# Patient Record
Sex: Female | Born: 1985 | Hispanic: Yes | Marital: Single | Smoking: Never smoker
Health system: Southern US, Community
[De-identification: ages and names within clinical notes are randomized; demographics above are authoritative.]

## PROBLEM LIST (undated history)

## (undated) DIAGNOSIS — R87629 Unspecified abnormal cytological findings in specimens from vagina: Secondary | ICD-10-CM

## (undated) HISTORY — DX: Unspecified abnormal cytological findings in specimens from vagina: R87.629

---

## 2015-11-08 ENCOUNTER — Ambulatory Visit (INDEPENDENT_AMBULATORY_CARE_PROVIDER_SITE_OTHER): Payer: Self-pay | Admitting: Osteopathic Medicine

## 2015-11-08 ENCOUNTER — Encounter: Payer: Self-pay | Admitting: Osteopathic Medicine

## 2015-11-08 VITALS — BP 95/56 | HR 78 | Ht 61.5 in | Wt 162.0 lb

## 2015-11-08 DIAGNOSIS — E049 Nontoxic goiter, unspecified: Secondary | ICD-10-CM | POA: Insufficient documentation

## 2015-11-08 DIAGNOSIS — Z0189 Encounter for other specified special examinations: Secondary | ICD-10-CM

## 2015-11-08 DIAGNOSIS — Z008 Encounter for other general examination: Secondary | ICD-10-CM

## 2015-11-08 DIAGNOSIS — Z3483 Encounter for supervision of other normal pregnancy, third trimester: Secondary | ICD-10-CM

## 2015-11-08 LAB — LIPID PANEL
CHOL/HDL RATIO: 2.5 ratio (ref <=5.0)
Cholesterol: 203 mg/dL — ABNORMAL HIGH (ref 125–200)
HDL: 80 mg/dL (ref >=46)
LDL CALC: 95 mg/dL (ref <130)
TRIGLYCERIDES: 142 mg/dL (ref <150)
VLDL: 28 mg/dL (ref <30)

## 2015-11-08 LAB — GLUCOSE, RANDOM: Glucose, Bld: 73 mg/dL (ref 65–99)

## 2015-11-08 NOTE — Patient Instructions (Signed)
Please let us know if you haven't heard back from our office about your results and your paperwork by Wednesday of this week! We will fax the completed form and leave a copy for you to pick up at our front desk, as well. Your cholesterol may be high due to pregnancy. I will note on your form that pregnancy may affect the lab values. Please let me know if your insurer or employer has any questions about the form.   Take care!

## 2015-11-08 NOTE — Progress Notes (Signed)
HPI: Morgan Avila is a 30 y.o. female who presents to [facility] today for chief complaint of:  Chief Complaint  Patient presents with  . Establish Care    BIOMETRIC SCREEN      Patient here to get biometric screening for work/insurance purposes. She brings the form with her. No other complaints. No chronic medical conditions. Patient is currently pregnant, following with OB/GYN and no problems with this pregnancy. No history of gestational hypertension or diabetes.  Records reviewed:  10/29/15 OBGYN visit for prenatal care - G3P1 at [redacted]w[redacted]d putting her at [redacted]w[redacted]d now.  Recent one-hour glucose test negative. Reviewed recent HIV, Pap, flu shot.     Past medical, social and family history reviewed: History reviewed. No pertinent past medical history. History reviewed. No pertinent past surgical history. Social History  Substance Use Topics  . Smoking status: Never Smoker   . Smokeless tobacco: Not on file  . Alcohol Use: Not on file   History reviewed. No pertinent family history.  Current Outpatient Prescriptions  Medication Sig Dispense Refill  . Prenatal Vit-Fe Fumarate-FA (PRENATAL MULTIVITAMIN) TABS tablet Take 1 tablet by mouth daily at 12 noon.     No current facility-administered medications for this visit.   No Known Allergies     Review of Systems: CONSTITUTIONAL:  No  fever, no chills, No  unintentional weight changes HEAD/EYES/EARS/NOSE/THROAT: No  headache, no vision change, no hearing change, No  sore throat, No  sinus pressure CARDIAC: No  chest pain, No  pressure, No palpitations, No  orthopnea RESPIRATORY: No  cough, No  shortness of breath/wheeze GASTROINTESTINAL: No  nausea, No  vomiting, No  abdominal pain, No  blood in stool, No  diarrhea, No  constipation  MUSCULOSKELETAL: No  myalgia/arthralgia GENITOURINARY: No  incontinence, No  abnormal genital bleeding/discharge SKIN: No  rash/wounds/concerning lesions HEM/ONC: No   easy bruising/bleeding, No  abnormal lymph node ENDOCRINE: No polyuria/polydipsia/polyphagia, No  heat/cold intolerance  NEUROLOGIC: No  weakness, No  dizziness, No  slurred speech PSYCHIATRIC: No  concerns with depression, No  concerns with anxiety, No sleep problems  Exam:  BP 95/56 mmHg  Pulse 78  Ht 5' 1.5" (1.562 m)  Wt 162 lb (73.483 kg)  BMI 30.12 kg/m2 Constitutional: VS see above. General Appearance: alert, well-developed, well-nourished, NAD Eyes: Normal lids and conjunctive, non-icteric sclera,  Ears, Nose, Mouth, Throat: MMM, Normal external inspection ears/nares/mouth/lips/gums,  Neck: No masses, trachea midline. Mild symmetric thyroid enlargement. No tenderness/mass appreciated. No lymphadenopathy Respiratory: Normal respiratory effort. no wheeze, no rhonchi, no rales Cardiovascular: S1/S2 normal, no murmur, no rub/gallop auscultated. RRR.  No lower extremity edema. Gastrointestinal: Nontender, no masses. Gravid uterus Musculoskeletal: Gait normal. No clubbing/cyanosis of digits.  Neurological: No cranial nerve deficit on limited exam. Motor and sensation intact and symmetric Skin: warm, dry, intact. No rash/ulcer. No concerning nevi or subq nodules on limited exam.   Psychiatric: Normal judgment/insight. Normal mood and affect. Oriented x3.    No results found for this or any previous visit (from the past 72 hour(s)).    ASSESSMENT/PLAN:  Encounter for biometric screening - Of note, lipids expected to be elevated given that the patient is pregnant. Waist circumference was not measured. - Plan: Glucose, Lipid panel  Encounter for supervision of other normal pregnancy in third trimester - Records reviewed, following with OB/GYN, no concerns at this time.   Enlarged thyroid - Mildly enlarged, soft, no nodules, nontender. Would hold off on TSH until after pregnancy as  patient is currently asymptomatic.   Return AS NEEDED.

## 2016-11-30 ENCOUNTER — Encounter: Payer: Self-pay | Admitting: Osteopathic Medicine

## 2016-11-30 ENCOUNTER — Ambulatory Visit (INDEPENDENT_AMBULATORY_CARE_PROVIDER_SITE_OTHER): Payer: 59 | Admitting: Osteopathic Medicine

## 2016-11-30 VITALS — BP 105/60 | HR 65 | Ht 64.0 in | Wt 146.0 lb

## 2016-11-30 DIAGNOSIS — Z0000 Encounter for general adult medical examination without abnormal findings: Secondary | ICD-10-CM | POA: Diagnosis not present

## 2016-11-30 DIAGNOSIS — E049 Nontoxic goiter, unspecified: Secondary | ICD-10-CM

## 2016-11-30 DIAGNOSIS — Z0189 Encounter for other specified special examinations: Secondary | ICD-10-CM

## 2016-11-30 DIAGNOSIS — Z008 Encounter for other general examination: Secondary | ICD-10-CM

## 2016-11-30 LAB — COMPLETE METABOLIC PANEL WITH GFR
ALBUMIN: 3.8 g/dL (ref 3.6–5.1)
ALK PHOS: 63 U/L (ref 33–115)
ALT: 10 U/L (ref 6–29)
AST: 14 U/L (ref 10–30)
BILIRUBIN TOTAL: 0.5 mg/dL (ref 0.2–1.2)
BUN: 15 mg/dL (ref 7–25)
CALCIUM: 8.9 mg/dL (ref 8.6–10.2)
CO2: 24 mmol/L (ref 20–31)
CREATININE: 0.65 mg/dL (ref 0.50–1.10)
Chloride: 106 mmol/L (ref 98–110)
GFR, Est African American: >89 mL/min (ref >=60)
GFR, Est Non African American: >89 mL/min (ref >=60)
Glucose, Bld: 75 mg/dL (ref 65–99)
POTASSIUM: 4.1 mmol/L (ref 3.5–5.3)
Sodium: 141 mmol/L (ref 135–146)
Total Protein: 6.7 g/dL (ref 6.1–8.1)

## 2016-11-30 LAB — LIPID PANEL
CHOL/HDL RATIO: 2.3 ratio (ref <5.0)
CHOLESTEROL: 119 mg/dL (ref <200)
HDL: 52 mg/dL (ref >50)
LDL Cholesterol: 61 mg/dL (ref <100)
Triglycerides: 30 mg/dL (ref <150)
VLDL: 6 mg/dL (ref <30)

## 2016-11-30 LAB — TSH: TSH: 0.66 m[IU]/L

## 2016-11-30 NOTE — Progress Notes (Signed)
HPI: Morgan Avila is a 31 y.o. female who presents to [facility] today for chief complaint of:  Chief Complaint  Patient presents with  . Annual Exam    Patient here to get biometric screening for work/insurance purposes. She brings the form with her. No other complaints. No chronic medical conditions. No history of gestational hypertension or diabetes.  See below for review of preventive care    Past medical, social and family history reviewed: History reviewed. No pertinent past medical history. History reviewed. No pertinent surgical history. Social History  Substance Use Topics  . Smoking status: Never Smoker  . Smokeless tobacco: Not on file  . Alcohol use Not on file   No family history on file.  Current Outpatient Prescriptions  Medication Sig Dispense Refill  . PRESCRIPTION MEDICATION      No current facility-administered medications for this visit.    No Known Allergies     Review of Systems: CONSTITUTIONAL:  No  fever, no chills, No  unintentional weight changes HEAD/EYES/EARS/NOSE/THROAT: No  headache, no vision change CARDIAC: No  chest pain, No  pressure, No palpitations, No  orthopnea RESPIRATORY: No  cough, No  shortness of breath/wheeze GASTROINTESTINAL: No  nausea, No  vomiting, No  abdominal pain, MUSCULOSKELETAL: No  myalgia/arthralgia GENITOURINARY:No  abnormal genital bleeding/discharge SKIN: No  rash/wounds/concerning lesions ENDOCRINE: No polyuria/polydipsia/polyphagia, No  heat/cold intolerance  NEUROLOGIC: No  weakness, No  dizziness PSYCHIATRIC: No  concerns with depression, No  concerns with anxiety, No sleep problems  Exam:  BP 105/60   Pulse 65   Ht 5\' 4"  (1.626 m)   Wt 146 lb (66.2 kg)   BMI 25.06 kg/m  Constitutional: VS see above. General Appearance: alert, well-developed, well-nourished, NAD Eyes: Normal lids and conjunctive, non-icteric sclera,  Ears, Nose, Mouth, Throat: MMM, Normal external  inspection ears/nares/mouth/lips/gums,  Neck: No masses, trachea midline. Mild symmetric thyroid enlargement. No tenderness/mass appreciated. No lymphadenopathy Respiratory: Normal respiratory effort. no wheeze, no rhonchi, no rales Cardiovascular: S1/S2 normal, no murmur, no rub/gallop auscultated. RRR.  No lower extremity edema. Gastrointestinal: Nontender, no masses. Gravid uterus Musculoskeletal: Gait normal. No clubbing/cyanosis of digits.  Neurological: No cranial nerve deficit on limited exam. Motor and sensation intact and symmetric Skin: warm, dry, intact. No rash/ulcer. No concerning nevi or subq nodules on limited exam.   Psychiatric: Normal judgment/insight. Normal mood and affect. Oriented x3.    ASSESSMENT/PLAN:  Annual physical exam - Plan: Lipid panel, COMPLETE METABOLIC PANEL WITH GFR  Enlarged thyroid - Plan: TSH  Encounter for biometric screening - Plan: Lipid panel, COMPLETE METABOLIC PANEL WITH GFR    FEMALE PREVENTIVE CARE Updated 11/30/16   ANNUAL SCREENING/COUNSELING  Diet/Exercise - HEALTHY HABITS DISCUSSED TO DECREASE CV RISK History  Smoking Status  . Never Smoker  Smokeless Tobacco  . Never Used   History  Alcohol use Not on file   Depression screen [facility] 2/9 11/30/2016  Decreased Interest 0  Down, Depressed, Hopeless 0  PHQ - 2 Score 0    Domestic violence concerns - no  HTN SCREENING - SEE VITALS  SEXUAL HEALTH  Sexually active in the past year - Yes with female.  Need/want STI testing today? - no  Concerns about libido or pain with sex? - no  Plans for pregnancy? - not at this time   INFECTIOUS DISEASE SCREENING  HIV - does not need  GC/CT - does not need  HepC - DOB 1945-1965 - does not need  TB -  does not need  DISEASE SCREENING  Lipid - needs  DM2 - needs  Osteoporosis - women age 33+ - does not need  CANCER SCREENING  Cervical - does not need  Breast - does not need  Lung - does not need  Colon - does not  need  ADULT VACCINATION  Influenza - annual vaccine recommended  Td - booster every 10 years   Zoster - option at 50, yes at 60+   PCV13 - was not indicated  PPSV23 - was not indicated  There is no immunization history on file for this patient.      [facility] (around [facility]30/2017[facility] sooner if needed / depending on lab results .

## 2017-11-30 ENCOUNTER — Encounter: Payer: 59 | Admitting: Osteopathic Medicine

## 2017-12-12 ENCOUNTER — Ambulatory Visit (INDEPENDENT_AMBULATORY_CARE_PROVIDER_SITE_OTHER): Payer: 59 | Admitting: Osteopathic Medicine

## 2017-12-12 ENCOUNTER — Encounter: Payer: Self-pay | Admitting: Osteopathic Medicine

## 2017-12-12 VITALS — BP 110/63 | HR 73 | Temp 98.7°F | Ht 64.0 in | Wt 144.0 lb

## 2017-12-12 DIAGNOSIS — Z23 Encounter for immunization: Secondary | ICD-10-CM

## 2017-12-12 DIAGNOSIS — Z0000 Encounter for general adult medical examination without abnormal findings: Secondary | ICD-10-CM | POA: Diagnosis not present

## 2017-12-12 LAB — COMPLETE METABOLIC PANEL WITH GFR
AG Ratio: 1.5 (calc) (ref 1.0–2.5)
ALBUMIN MSPROF: 4.1 g/dL (ref 3.6–5.1)
ALKALINE PHOSPHATASE (APISO): 77 U/L (ref 33–115)
ALT: 9 U/L (ref 6–29)
AST: 17 U/L (ref 10–30)
BILIRUBIN TOTAL: 0.4 mg/dL (ref 0.2–1.2)
BUN: 13 mg/dL (ref 7–25)
CHLORIDE: 104 mmol/L (ref 98–110)
CO2: 27 mmol/L (ref 20–32)
Calcium: 8.8 mg/dL (ref 8.6–10.2)
Creat: 0.68 mg/dL (ref 0.50–1.10)
GFR, Est African American: 137 mL/min/{1.73_m2} (ref >=60)
GFR, Est Non African American: 118 mL/min/{1.73_m2} (ref >=60)
GLOBULIN: 2.7 g/dL (ref 1.9–3.7)
Glucose, Bld: 77 mg/dL (ref 65–99)
POTASSIUM: 3.8 mmol/L (ref 3.5–5.3)
SODIUM: 137 mmol/L (ref 135–146)
Total Protein: 6.8 g/dL (ref 6.1–8.1)

## 2017-12-12 LAB — CBC
HEMATOCRIT: 36.3 % (ref 35.0–45.0)
HEMOGLOBIN: 12.5 g/dL (ref 11.7–15.5)
MCH: 29.3 pg (ref 27.0–33.0)
MCHC: 34.4 g/dL (ref 32.0–36.0)
MCV: 85.2 fL (ref 80.0–100.0)
MPV: 12.6 fL — ABNORMAL HIGH (ref 7.5–12.5)
Platelets: 141 10*3/uL (ref 140–400)
RBC: 4.26 10*6/uL (ref 3.80–5.10)
RDW: 12.5 % (ref 11.0–15.0)
WBC: 7.1 10*3/uL (ref 3.8–10.8)

## 2017-12-12 LAB — LIPID PANEL
CHOL/HDL RATIO: 2.1 (calc) (ref <5.0)
CHOLESTEROL: 117 mg/dL (ref <200)
HDL: 55 mg/dL (ref >50)
LDL Cholesterol (Calc): 50 mg/dL (calc)
Non-HDL Cholesterol (Calc): 62 mg/dL (calc) (ref <130)
Triglycerides: 43 mg/dL (ref <150)

## 2017-12-12 NOTE — Patient Instructions (Signed)
Preventive Care 18-39 Years, Female Preventive care refers to lifestyle choices and visits with your health care provider that can promote health and wellness. What does preventive care include?  A yearly physical exam. This is also called an annual well check.  Dental exams once or twice a year.  Routine eye exams. Ask your health care provider how often you should have your eyes checked.  Personal lifestyle choices, including: ? Daily care of your teeth and gums. ? Regular physical activity. ? Eating a healthy diet. ? Avoiding tobacco and drug use. ? Limiting alcohol use. ? Practicing safe sex. ? Taking vitamin and mineral supplements as recommended by your health care provider. What happens during an annual well check? The services and screenings done by your health care provider during your annual well check will depend on your age, overall health, lifestyle risk factors, and family history of disease. Counseling Your health care provider may ask you questions about your:  Alcohol use.  Tobacco use.  Drug use.  Emotional well-being.  Home and relationship well-being.  Sexual activity.  Eating habits.  Work and work Statistician.  Method of birth control.  Menstrual cycle.  Pregnancy history.  Screening You may have the following tests or measurements:  Height, weight, and BMI.  Diabetes screening. This is done by checking your blood sugar (glucose) after you have not eaten for a while (fasting).  Blood pressure.  Lipid and cholesterol levels. These may be checked every 5 years starting at age 66.  Skin check.  Hepatitis C blood test.  Hepatitis B blood test.  Sexually transmitted disease (STD) testing.  BRCA-related cancer screening. This may be done if you have a family history of breast, ovarian, tubal, or peritoneal cancers.  Pelvic exam and Pap test. This may be done every 3 years starting at age 40. Starting at age 59, this may be done every 5  years if you have a Pap test in combination with an HPV test.  Discuss your test results, treatment options, and if necessary, the need for more tests with your health care provider. Vaccines Your health care provider may recommend certain vaccines, such as:  Influenza vaccine. This is recommended every year.  Tetanus, diphtheria, and acellular pertussis (Tdap, Td) vaccine. You may need a Td booster every 10 years.  Varicella vaccine. You may need this if you have not been vaccinated.  HPV vaccine. If you are 69 or younger, you may need three doses over 6 months.  Measles, mumps, and rubella (MMR) vaccine. You may need at least one dose of MMR. You may also need a second dose.  Pneumococcal 13-valent conjugate (PCV13) vaccine. You may need this if you have certain conditions and were not previously vaccinated.  Pneumococcal polysaccharide (PPSV23) vaccine. You may need one or two doses if you smoke cigarettes or if you have certain conditions.  Meningococcal vaccine. One dose is recommended if you are age 27-21 years and a first-year college student living in a residence hall, or if you have one of several medical conditions. You may also need additional booster doses.  Hepatitis A vaccine. You may need this if you have certain conditions or if you travel or work in places where you may be exposed to hepatitis A.  Hepatitis B vaccine. You may need this if you have certain conditions or if you travel or work in places where you may be exposed to hepatitis B.  Haemophilus influenzae type b (Hib) vaccine. You may need this if  you have certain risk factors.  Talk to your health care provider about which screenings and vaccines you need and how often you need them. This information is not intended to replace advice given to you by your health care provider. Make sure you discuss any questions you have with your health care provider. Document Released: 10/17/2001 Document Revised: 05/10/2016  Document Reviewed: 06/22/2015 Elsevier Interactive Patient Education  [employer].

## 2017-12-12 NOTE — Progress Notes (Signed)
HPI: Morgan Avila is a 32 y.o. female who presents to [facility] today for chief complaint of:  Biometric screen, annual checkup   Patient here to get biometric screening for work/insurance purposes. She brings the form with her. No other complaints. No chronic medical conditions. No history of gestational hypertension or diabetes.  See below for review of preventive care    Past medical, social and family history reviewed: No past medical history on file.   No past surgical history on file.   Social History   Tobacco Use  . Smoking status: Never Smoker  . Smokeless tobacco: Never Used  Substance Use Topics  . Alcohol use: Not on file   No family history on file.   Current Outpatient Medications  Medication Sig Dispense Refill  . PRESCRIPTION MEDICATION      No current facility-administered medications for this visit.    No Known Allergies     Review of Systems: CONSTITUTIONAL:  No  fever, no chills, No  unintentional weight changes HEAD/EYES/EARS/NOSE/THROAT: No  headache, no vision change CARDIAC: No  chest pain, No  pressure, No palpitations, No  orthopnea RESPIRATORY: No  cough, No  shortness of breath/wheeze GASTROINTESTINAL: No  nausea, No  vomiting, No  abdominal pain, MUSCULOSKELETAL: No  myalgia/arthralgia GENITOURINARY:No  abnormal genital bleeding/discharge SKIN: No  rash/wounds/concerning lesions ENDOCRINE: No polyuria/polydipsia/polyphagia, No  heat/cold intolerance  NEUROLOGIC: No  weakness, No  dizziness PSYCHIATRIC: No  concerns with depression, No  concerns with anxiety, No sleep problems  Exam:  BP 110/63 (BP Location: Left Arm, Patient Position: Sitting, Cuff Size: Normal)   Pulse 73   Temp 98.7 F (37.1 C) (Oral)   Ht 5\' 4"  (1.626 m)   Wt 144 lb (65.3 kg)   LMP 11/16/2017   BMI 24.72 kg/m  Constitutional: VS see above. General Appearance: alert, well-developed, well-nourished, NAD Eyes: Normal lids  and conjunctive, non-icteric sclera,  Ears, Nose, Mouth, Throat: MMM, Normal external inspection ears/nares/mouth/lips/gums,  Neck: No masses, trachea midline. Mild symmetric thyroid enlargement. No tenderness/mass appreciated. No lymphadenopathy Respiratory: Normal respiratory effort. no wheeze, no rhonchi, no rales Cardiovascular: S1/S2 normal, no murmur, no rub/gallop auscultated. RRR. No lower extremity edema. Gastrointestinal: Nontender, no masses. Gravid uterus Musculoskeletal: Gait normal. No clubbing/cyanosis of digits.  Neurological: No cranial nerve deficit on limited exam. Motor and sensation intact and symmetric Skin: warm, dry, intact. No rash/ulcer. No concerning nevi or subq nodules on limited exam.   Psychiatric: Normal judgment/insight. Normal mood and affect. Oriented x3.    ASSESSMENT/PLAN:  Annual physical exam - Plan: CBC, COMPLETE METABOLIC PANEL WITH GFR, Lipid panel  Need for Tdap vaccination - Plan: Tdap vaccine greater than or equal to 7yo IM  Need for HPV vaccination - Plan: HPV 9-valent vaccine,Recombinat    FEMALE PREVENTIVE CARE Updated 12/12/17   ANNUAL SCREENING/COUNSELING  Diet/Exercise - HEALTHY HABITS DISCUSSED TO DECREASE CV RISK Social History   Tobacco Use  Smoking Status Never Smoker  Smokeless Tobacco Never Used   Social History   Substance and Sexual Activity  Alcohol Use Not on file   Depression screen [facility] 2/9 11/30/2016  Decreased Interest 0  Down, Depressed, Hopeless 0  PHQ - 2 Score 0    Domestic violence concerns - no  HTN SCREENING - SEE VITALS  SEXUAL HEALTH  Sexually active in the past year - Yes with female.  Need/want STI testing today? - no  Concerns about libido or pain with sex? - no  Plans  for pregnancy? - not at this time   INFECTIOUS DISEASE SCREENING  HIV - does not need  GC/CT - does not need  HepC - DOB 1945-1965 - does not need  TB - does not need  DISEASE SCREENING  Lipid - needs  DM2 -  needs  Osteoporosis - women age 46+ - does not need  CANCER SCREENING  Cervical - last done 06/2015, she is not technically due until the fall and would rather wait until then   Breast - does not need - no FH   Lung - does not need  Colon - does not need - no FH  ADULT VACCINATION  Influenza - annual vaccine recommended  Td - booster every 10 years   Zoster - option at 5750, yes at 60+   PCV13 - was not indicated  PPSV23 - was not indicated Immunization History  Administered Date(s) Administered  . HPV 9-valent 12/12/2017  . Influenza,inj,Quad PF,6+ Mos 06/18/2015  . Tdap 12/12/2017      Return in for 2nd HPV shot after 02/11/18, 3rd HPV shot w/ Pap after 06/13/18.

## 2017-12-26 ENCOUNTER — Telehealth: Payer: Self-pay

## 2017-12-26 NOTE — Telephone Encounter (Signed)
Pt called wanting to confirm that her biometrics for was faxed because he work is not showing where it was received.   Called and advised pt that form was faxed and confirmation was received on 12-13-17. Offered to put a copy up front for pt to pick up and she states she will come get it today. No further needs at this time.

## 2018-02-11 ENCOUNTER — Ambulatory Visit (INDEPENDENT_AMBULATORY_CARE_PROVIDER_SITE_OTHER): Payer: 59 | Admitting: Osteopathic Medicine

## 2018-02-11 VITALS — Temp 98.7°F

## 2018-02-11 DIAGNOSIS — Z23 Encounter for immunization: Secondary | ICD-10-CM

## 2018-02-11 NOTE — Progress Notes (Signed)
   Subjective:    Patient ID: Morgan Avila, female    DOB: May 04, 1986, 32 y.o.   MRN: [id number]  HPI  Morgan Avila is here for 2nd HPV vaccine. Denies problems with vaccines in the past.   Review of Systems     Objective:   Physical Exam        Assessment & Plan:  Patient tolerated injection well without complications. Patient advised to schedule next injection on 06/13/2018 or soon after.

## 2018-06-13 ENCOUNTER — Ambulatory Visit (INDEPENDENT_AMBULATORY_CARE_PROVIDER_SITE_OTHER): Payer: 59 | Admitting: Osteopathic Medicine

## 2018-06-13 ENCOUNTER — Other Ambulatory Visit (HOSPITAL_COMMUNITY)
Admission: RE | Admit: 2018-06-13 | Discharge: 2018-06-13 | Disposition: A | Discharge status: Home / Self Care | Payer: 59 | Source: Ambulatory Visit | Attending: Osteopathic Medicine | Admitting: Osteopathic Medicine

## 2018-06-13 ENCOUNTER — Encounter: Payer: Self-pay | Admitting: Osteopathic Medicine

## 2018-06-13 VITALS — BP 108/64 | HR 74 | Temp 98.0°F | Wt 150.9 lb

## 2018-06-13 DIAGNOSIS — R87810 Cervical high risk human papillomavirus (HPV) DNA test positive: Secondary | ICD-10-CM | POA: Diagnosis not present

## 2018-06-13 DIAGNOSIS — R87612 Low grade squamous intraepithelial lesion on cytologic smear of cervix (LGSIL): Secondary | ICD-10-CM

## 2018-06-13 DIAGNOSIS — Z23 Encounter for immunization: Secondary | ICD-10-CM | POA: Diagnosis not present

## 2018-06-13 DIAGNOSIS — Z124 Encounter for screening for malignant neoplasm of cervix: Secondary | ICD-10-CM | POA: Insufficient documentation

## 2018-06-13 DIAGNOSIS — R8789 Other abnormal findings in specimens from female genital organs: Secondary | ICD-10-CM

## 2018-06-13 DIAGNOSIS — R87618 Other abnormal cytological findings on specimens from cervix uteri: Secondary | ICD-10-CM

## 2018-06-13 NOTE — Progress Notes (Signed)
HPI: Morgan Avila is a 32 y.o. female who  has no past medical history on file.  she presents to [facility] today, 06/13/18,  for chief complaint of:  Pap only - annual physical already done      Past medical history, surgical history, and family history reviewed.  Current medication list and allergy/intolerance information reviewed.   (See remainder of HPI, ROS, Phys Exam below)    ASSESSMENT/PLAN:   Cervical cancer screening - Plan: Cytology - PAP  Need for HPV vaccination - Plan: HPV 9-valent vaccine,Recombinat     Follow-up plan: Return for annual approximately 12/2018.     ############################################ ############################################ ############################################ ############################################    Outpatient Encounter Medications as of 06/13/2018  Medication Sig  . PRESCRIPTION MEDICATION    No facility-administered encounter medications on file as of 06/13/2018.    No Known Allergies    Review of Systems:  Constitutional: No recent illness  GU: no abnormal vaginal bleeding or discharge   Skin: No  Rash   Exam:  BP 108/64 (BP Location: Left Arm, Patient Position: Sitting, Cuff Size: Normal)   Pulse 74   Temp 98 F (36.7 C) (Oral)   Wt 150 lb 14.4 oz (68.4 kg)   BMI 25.90 kg/m   Constitutional: VS see above. General Appearance: alert, well-developed, well-nourished, NAD  Skin: warm, dry, intact.   Psychiatric: Normal judgment/insight. Normal mood and affect. Oriented x3.  GYN: No lesions/ulcers to external genitalia, normal urethra, normal vaginal mucosa, physiologic discharge, cervix normal without lesions, uterus not enlarged or tender, adnexa no masses and nontender  BREAST: No rashes/skin changes, normal fibrous breast tissue, no masses or tenderness, normal nipple without discharge, normal axilla   Visit summary with medication list and  pertinent instructions was printed for patient to review, advised to alert [place] if any changes needed. All questions at time of visit were answered - patient instructed to contact office with any additional concerns. ER/RTC precautions were reviewed with the patient and understanding verbalized.   Follow-up plan: Return for annual approximately 12/2018.  Note: Total time spent 10 minutes, greater than 50% of the visit was spent face-to-face counseling and coordinating care for the following: Diagnoses of Need for HPV vaccination, Cervical cancer screening, and Routine screening for STI (sexually transmitted infection) were pertinent to this visit.Marland Kitchen  Please note: voice recognition software was used to produce this document, and typos may escape review. Please contact Dr. Lyn Hollingshead for any needed clarifications.

## 2018-06-21 LAB — CYTOLOGY - PAP
HPV 16/18/45 genotyping: NEGATIVE
HPV: DETECTED — AB

## 2018-06-24 DIAGNOSIS — N870 Mild cervical dysplasia: Secondary | ICD-10-CM | POA: Insufficient documentation

## 2018-06-24 DIAGNOSIS — R87618 Other abnormal cytological findings on specimens from cervix uteri: Secondary | ICD-10-CM | POA: Insufficient documentation

## 2018-06-24 DIAGNOSIS — R8789 Other abnormal findings in specimens from female genital organs: Secondary | ICD-10-CM | POA: Insufficient documentation

## 2018-06-24 NOTE — Addendum Note (Signed)
Addended by: Deirdre Pippins on: 06/24/2018 08:11 AM   Modules accepted: Orders

## 2018-07-04 ENCOUNTER — Ambulatory Visit (INDEPENDENT_AMBULATORY_CARE_PROVIDER_SITE_OTHER): Payer: 59 | Admitting: Obstetrics & Gynecology

## 2018-07-04 ENCOUNTER — Encounter: Payer: 59 | Admitting: Obstetrics & Gynecology

## 2018-07-04 ENCOUNTER — Encounter: Payer: Self-pay | Admitting: Obstetrics & Gynecology

## 2018-07-04 VITALS — BP 95/68 | HR 78 | Resp 16 | Ht 62.0 in | Wt 150.0 lb

## 2018-07-04 DIAGNOSIS — Z3202 Encounter for pregnancy test, result negative: Secondary | ICD-10-CM | POA: Diagnosis not present

## 2018-07-04 DIAGNOSIS — R87612 Low grade squamous intraepithelial lesion on cytologic smear of cervix (LGSIL): Secondary | ICD-10-CM | POA: Diagnosis not present

## 2018-07-04 DIAGNOSIS — B977 Papillomavirus as the cause of diseases classified elsewhere: Secondary | ICD-10-CM | POA: Diagnosis not present

## 2018-07-04 DIAGNOSIS — Z01812 Encounter for preprocedural laboratory examination: Secondary | ICD-10-CM

## 2018-07-04 LAB — POCT URINE PREGNANCY: Preg Test, Ur: NEGATIVE

## 2018-07-04 NOTE — Addendum Note (Signed)
Addended by: Granville Lewis on: 07/04/2018 03:03 PM   Modules accepted: Orders

## 2018-07-04 NOTE — Progress Notes (Signed)
   Subjective:    Patient ID: Morgan Avila, female    DOB: 02/02/1986, 32 y.o.   MRN: [id number]  HPI 32 yo Hispanic P2 (2 and 86 yo kids) here for a colpo due to a LGSIL pap earlier this month.   Review of Systems     Objective:   Physical Exam  Breathing, conversing, and ambulating normally Well nourished, well hydrated Latina, no apparent distress  UPT negative, consent signed, time out done Cervix prepped with acetic acid. Transformation zone seen in its entirety. Colpo adequate. 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 Silver nitrate achieved hemostasis. ECC obtained. She tolerated the procedure well.     Assessment & Plan:  LGSIL on pap c/w colpo findings Await pathology Come back for results/ treatment plan in a week Rec start Gardasil today

## 2018-07-11 ENCOUNTER — Encounter: Payer: Self-pay | Admitting: Family Medicine

## 2018-07-11 ENCOUNTER — Ambulatory Visit (INDEPENDENT_AMBULATORY_CARE_PROVIDER_SITE_OTHER): Payer: 59 | Admitting: Family Medicine

## 2018-07-11 DIAGNOSIS — N870 Mild cervical dysplasia: Secondary | ICD-10-CM

## 2018-07-11 NOTE — Assessment & Plan Note (Signed)
F/U pap with co-testing in 1 year

## 2018-07-11 NOTE — Addendum Note (Signed)
Addended by: Kathie Dike on: 07/11/2018 08:36 AM   Modules accepted: Orders

## 2018-07-11 NOTE — Progress Notes (Signed)
   Subjective:    Patient ID: Morgan Avila is a 32 y.o. female presenting with Results  on 07/11/2018  HPI: Here for f/u colpo results. Noted to have LGSIL on pap . Colpo shows CIN1 with negative ECC.  Review of Systems  Constitutional: Negative for chills and fever.  Respiratory: Negative for shortness of breath.   Cardiovascular: Negative for chest pain.  Gastrointestinal: Negative for abdominal pain, nausea and vomiting.  Genitourinary: Negative for dysuria.  Skin: Negative for rash.      Objective:    BP 109/71   Pulse 78   Ht 5\' 2"  (1.575 m)   Wt 149 lb (67.6 kg)   LMP 06/30/2018   BMI 27.25 kg/m  Physical Exam  Constitutional: She is oriented to person, place, and time. She appears well-developed and well-nourished. No distress.  HENT:  Head: Normocephalic and atraumatic.  Eyes: No scleral icterus.  Neck: Neck supple.  Cardiovascular: Normal rate.  Pulmonary/Chest: Effort normal.  Abdominal: Soft.  Neurological: She is alert and oriented to person, place, and time.  Skin: Skin is warm and dry.  Psychiatric: She has a normal mood and affect.        Assessment & Plan:   Problem List Items Addressed This Visit      Unprioritized   Dysplasia of cervix, low grade (CIN 1)    F/U pap with co-testing in 1 year         Total face-to-face time with patient: 10 minutes. Over 50% of encounter was spent on counseling and coordination of care. Return in about 1 year (around 07/12/2019) for repeat pap.  Reva Bores 07/11/2018 3:00 PM

## 2018-08-30 ENCOUNTER — Ambulatory Visit (INDEPENDENT_AMBULATORY_CARE_PROVIDER_SITE_OTHER): Payer: 59 | Admitting: *Deleted

## 2018-08-30 VITALS — BP 98/67 | HR 68 | Ht 62.0 in | Wt 150.0 lb

## 2018-08-30 DIAGNOSIS — R309 Painful micturition, unspecified: Secondary | ICD-10-CM

## 2018-08-30 DIAGNOSIS — R300 Dysuria: Secondary | ICD-10-CM

## 2018-08-30 LAB — POCT URINALYSIS DIPSTICK
BILIRUBIN UA: NEGATIVE
Glucose, UA: NEGATIVE
Ketones, UA: NEGATIVE
Nitrite, UA: NEGATIVE
PH UA: 6.5 (ref 5.0–8.0)
PROTEIN UA: POSITIVE — AB
Spec Grav, UA: 1.015 (ref 1.010–1.025)
UROBILINOGEN UA: NEGATIVE U/dL — AB

## 2018-08-30 MED ORDER — SULFAMETHOXAZOLE-TRIMETHOPRIM 800-160 MG PO TABS: 1.0000 | ORAL_TABLET | Freq: Two times a day (BID) | ORAL | 0 refills | Status: DC

## 2018-08-30 NOTE — Progress Notes (Signed)
SUBJECTIVE: Quenton FetterDenise Silverman is a 32 y.o. female who complains of urinary frequency, urgency and dysuria x5 days, without flank pain, fever, chills, or abnormal vaginal discharge or bleeding.   OBJECTIVE: Appears well, in no apparent distress.  Vital signs are normal. Urine dipstick shows positive for RBC's and leukecytes.    ASSESSMENT: Dysuria  PLAN: Treatment per orders.  Call or return to clinic prn if these symptoms worsen or fail to improve as anticipated.

## 2018-09-02 ENCOUNTER — Ambulatory Visit: Payer: 59

## 2018-09-02 LAB — CERVICOVAGINAL ANCILLARY ONLY
Bacterial vaginitis: POSITIVE — AB
Candida vaginitis: NEGATIVE

## 2018-09-02 LAB — URINE CULTURE
MICRO NUMBER: 91544790
SPECIMEN QUALITY:: ADEQUATE

## 2018-09-03 ENCOUNTER — Telehealth: Payer: Self-pay | Admitting: *Deleted

## 2018-09-03 ENCOUNTER — Other Ambulatory Visit: Payer: Self-pay | Admitting: Obstetrics & Gynecology

## 2018-09-03 MED ORDER — METRONIDAZOLE 500 MG PO TABS: 500.0000 mg | ORAL_TABLET | Freq: Two times a day (BID) | ORAL | 0 refills | Status: DC

## 2018-09-03 NOTE — Telephone Encounter (Signed)
-----   Message from Allie BossierMyra C Dove, MD sent at 09/03/2018  8:17 AM EST ----- Her wet prep showed bv. Please let her know that I have prescribed flagyl. Thanks

## 2018-09-03 NOTE — Telephone Encounter (Signed)
LM on voicemail that she was positive for BV and Dr Marice Potterove has sent a RX into her pharmacy.  Encouraged her to call office if she has any questions.

## 2018-09-03 NOTE — Progress Notes (Signed)
Flagyl prescribed for bv seen on wet prep. 

## 2018-12-11 ENCOUNTER — Encounter: Payer: 59 | Admitting: Osteopathic Medicine

## 2019-01-02 ENCOUNTER — Ambulatory Visit: Payer: 59

## 2019-01-22 ENCOUNTER — Encounter: Payer: 59 | Admitting: Osteopathic Medicine

## 2019-02-04 ENCOUNTER — Encounter: Payer: Self-pay | Admitting: Advanced Practice Midwife

## 2019-02-04 ENCOUNTER — Ambulatory Visit (INDEPENDENT_AMBULATORY_CARE_PROVIDER_SITE_OTHER): Payer: 59 | Admitting: Advanced Practice Midwife

## 2019-02-04 ENCOUNTER — Other Ambulatory Visit: Payer: Self-pay

## 2019-02-04 VITALS — BP 104/68 | HR 74 | Ht 62.0 in | Wt 156.0 lb

## 2019-02-04 DIAGNOSIS — B373 Candidiasis of vulva and vagina: Secondary | ICD-10-CM | POA: Diagnosis not present

## 2019-02-04 DIAGNOSIS — Z113 Encounter for screening for infections with a predominantly sexual mode of transmission: Secondary | ICD-10-CM | POA: Diagnosis not present

## 2019-02-04 DIAGNOSIS — Z3202 Encounter for pregnancy test, result negative: Secondary | ICD-10-CM

## 2019-02-04 DIAGNOSIS — B9689 Other specified bacterial agents as the cause of diseases classified elsewhere: Secondary | ICD-10-CM

## 2019-02-04 DIAGNOSIS — N939 Abnormal uterine and vaginal bleeding, unspecified: Secondary | ICD-10-CM

## 2019-02-04 DIAGNOSIS — R300 Dysuria: Secondary | ICD-10-CM | POA: Diagnosis not present

## 2019-02-04 DIAGNOSIS — N760 Acute vaginitis: Secondary | ICD-10-CM | POA: Diagnosis not present

## 2019-02-04 DIAGNOSIS — N898 Other specified noninflammatory disorders of vagina: Secondary | ICD-10-CM | POA: Diagnosis not present

## 2019-02-04 LAB — POCT URINALYSIS DIPSTICK
Bilirubin, UA: NEGATIVE
Glucose, UA: NEGATIVE
Ketones, UA: NEGATIVE
Nitrite, UA: NEGATIVE
Protein, UA: NEGATIVE
Spec Grav, UA: 1.01 (ref 1.010–1.025)
Urobilinogen, UA: 0.2 E.U./dL
pH, UA: 8 (ref 5.0–8.0)

## 2019-02-04 LAB — POCT URINE PREGNANCY: Preg Test, Ur: NEGATIVE

## 2019-02-04 MED ORDER — FLUCONAZOLE 150 MG PO TABS: 150.0000 mg | ORAL_TABLET | Freq: Once | ORAL | 1 refills | Status: AC

## 2019-02-04 NOTE — Progress Notes (Signed)
  GYNECOLOGY PROGRESS NOTE  History:  33 y.o. [id number] presents to [facility] office today for problem gyn visit. She reports onset of vaginal itching/burning and burning with urination in the last week with onset of spotting 2 days ago. Bleeding is light, requiring but not soaking a pantyliner only. She usually has regular periods on OCPs with LMP 01/22/19 so this bleeding was unexpected.  She denies missing a pill or any lifestyle changes in the last month.  Her itching was similar to symptoms with yeast infection in the past but the bleeding caused her to become concerned. She had an abnormal Pap last year with plan to repeat Pap in November this year so she was worried about this. There are no other symptoms. She has not tried any treatments.  She denies h/a, dizziness, shortness of breath, n/v, or fever/chills.    The following portions of the patient's history were reviewed and updated as appropriate: allergies, current medications, past family history, past medical history, past social history, past surgical history and problem list. Last pap smear on 07/2018 was abnormal, positive HRHPV, with plan for repeat Pap with cotesting in 1 year.  Review of Systems:  Pertinent items are noted in HPI.   Objective:  Physical Exam Blood pressure 104/68, pulse 74, height 5\' 2"  (1.575 m), weight 156 lb (70.8 kg), last menstrual period 01/22/2019. VS reviewed, nursing note reviewed,  Constitutional: well developed, well nourished, no distress HEENT: normocephalic CV: normal rate Pulm/chest wall: normal effort Breast Exam: deferred Abdomen: soft Neuro: alert and oriented x 3 Skin: warm, dry Psych: affect normal Pelvic exam: Cervix pink, visually closed, without lesion, scant light red discharge, vaginal walls and external genitalia normal Bimanual exam: Cervix 0/long/high, firm, anterior, neg CMT, uterus nontender, nonenlarged, adnexa without tenderness, enlargement, or mass  Assessment & Plan:  1.  Vaginal itching --Swab collected but pt symptoms c/w yeast so will treat with Diflucan. - Cervicovaginal ancillary only( [place]) - fluconazole (DIFLUCAN) 150 MG tablet; Take 1 tablet (150 mg total) by mouth once for 1 dose.  Dispense: 1 tablet; Refill: 1  2. Dysuria --May be related to yeast, urine dip with large leukocytes but nitrite negative.  Urine culture sent. - Urine Culture - POCT Urinalysis Dipstick  3. Abnormal uterine bleeding (AUB) --Infection may be cause of abnormal bleeding.  Will treat with tests pending.  Pt to continue OCPs as prescribed and follow up if AUB continues. --Bleeding precautions given   Sharen Counter, CNM 1:31 PM

## 2019-02-04 NOTE — Patient Instructions (Signed)
Abnormal Uterine Bleeding  Abnormal uterine bleeding is unusual bleeding from the uterus. It includes:   Bleeding or spotting between periods.   Bleeding after sex.   Bleeding that is heavier than normal.   Periods that last longer than usual.   Bleeding after menopause.  Abnormal uterine bleeding can affect women at various stages in life, including teenagers, women in their reproductive years, pregnant women, and women who have reached menopause. Common causes of abnormal uterine bleeding include:   Pregnancy.   Growths of tissue (polyps).   A noncancerous tumor in the uterus (fibroid).   Infection.   Cancer.   Hormonal imbalances.  Any type of abnormal bleeding should be evaluated by a health care provider. Many cases are minor and simple to treat, while others are more serious. Treatment will depend on the cause of the bleeding.  Follow these instructions at home:   Monitor your condition for any changes.   Do not use tampons, douche, or have sex if told by your health care provider.   Change your pads often.   Get regular exams that include pelvic exams and cervical cancer screening.   Keep all follow-up visits as told by your health care provider. This is important.  Contact a health care provider if:   Your bleeding lasts for more than one week.   You feel dizzy at times.   You feel nauseous or you vomit.  Get help right away if:   You pass out.   Your bleeding soaks through a pad every hour.   You have abdominal pain.   You have a fever.   You become sweaty or weak.   You pass large blood clots from your vagina.  Summary   Abnormal uterine bleeding is unusual bleeding from the uterus.   Any type of abnormal bleeding should be evaluated by a health care provider. Many cases are minor and simple to treat, while others are more serious.   Treatment will depend on the cause of the bleeding.  This information is not intended to replace advice given to you by your health care provider.  Make sure you discuss any questions you have with your health care provider.  Document Released: 08/21/2005 Document Revised: 09/22/2016 Document Reviewed: 09/22/2016  Elsevier Interactive Patient Education  2019 Elsevier Inc.

## 2019-02-05 LAB — CERVICOVAGINAL ANCILLARY ONLY
Bacterial vaginitis: POSITIVE — AB
Candida vaginitis: POSITIVE — AB
Chlamydia: NEGATIVE
Neisseria Gonorrhea: NEGATIVE
Trichomonas: NEGATIVE

## 2019-02-06 DIAGNOSIS — N760 Acute vaginitis: Secondary | ICD-10-CM

## 2019-02-06 DIAGNOSIS — B379 Candidiasis, unspecified: Secondary | ICD-10-CM

## 2019-02-06 DIAGNOSIS — B9689 Other specified bacterial agents as the cause of diseases classified elsewhere: Secondary | ICD-10-CM

## 2019-02-06 MED ORDER — FLUCONAZOLE 150 MG PO TABS: 150.0000 mg | ORAL_TABLET | Freq: Once | ORAL | 0 refills | Status: AC

## 2019-02-06 MED ORDER — METRONIDAZOLE 500 MG PO TABS: 500.0000 mg | ORAL_TABLET | Freq: Two times a day (BID) | ORAL | 0 refills | Status: DC

## 2019-02-06 NOTE — Telephone Encounter (Signed)
Pt has been notified of positive bv and yeast via MyChart. Flagyl and Diflucan sent to pharmacy.

## 2019-02-07 LAB — URINE CULTURE
MICRO NUMBER:: 528631
SPECIMEN QUALITY:: ADEQUATE

## 2019-07-09 ENCOUNTER — Other Ambulatory Visit: Payer: Self-pay

## 2019-07-09 ENCOUNTER — Encounter: Payer: Self-pay | Admitting: Obstetrics & Gynecology

## 2019-07-09 ENCOUNTER — Ambulatory Visit (INDEPENDENT_AMBULATORY_CARE_PROVIDER_SITE_OTHER): Payer: 59 | Admitting: Obstetrics & Gynecology

## 2019-07-09 VITALS — BP 101/68 | HR 81 | Resp 16 | Ht 62.0 in | Wt 153.0 lb

## 2019-07-09 DIAGNOSIS — N852 Hypertrophy of uterus: Secondary | ICD-10-CM

## 2019-07-09 DIAGNOSIS — Z124 Encounter for screening for malignant neoplasm of cervix: Secondary | ICD-10-CM | POA: Diagnosis not present

## 2019-07-09 DIAGNOSIS — Z1151 Encounter for screening for human papillomavirus (HPV): Secondary | ICD-10-CM

## 2019-07-09 DIAGNOSIS — Z113 Encounter for screening for infections with a predominantly sexual mode of transmission: Secondary | ICD-10-CM

## 2019-07-09 DIAGNOSIS — N920 Excessive and frequent menstruation with regular cycle: Secondary | ICD-10-CM

## 2019-07-09 DIAGNOSIS — Z01419 Encounter for gynecological examination (general) (routine) without abnormal findings: Secondary | ICD-10-CM

## 2019-07-09 MED ORDER — NORGESTIMATE-ETH ESTRADIOL 0.25-35 MG-MCG PO TABS: 1.0000 | ORAL_TABLET | Freq: Every day | ORAL | 5 refills | Status: AC

## 2019-07-09 NOTE — Progress Notes (Signed)
Subjective:    Morgan Avila is a 33 y.o. single P2 (9yo son and 5 yo daughter) who presents for an annual exam. The patient has no complaints today. She needs a refill of OCPs.The patient is sexually active. 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 The patient wears seatbelts: yes. The patient participates in regular exercise: yes. (walking) Has the patient ever been transfused or tattooed?: yes. The patient reports that there is not domestic violence in her life.   Menstrual History: OB History    Gravida  3   Para  2   Term  2   Preterm      AB  1   Living  2     SAB      TAB  1   Ectopic      Multiple      Live Births              Menarche age: 55 Patient's last menstrual period was 06/14/2019.    The following portions of the patient's history were reviewed and updated as appropriate: allergies, current medications, past family history, past medical history, past social history, past surgical history and problem list.  Review of Systems Pertinent items are noted in HPI.   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 Periods are heavy  Objective:    BP 101/68   Pulse 81   Resp 16   Ht 5\' 2"  (1.575 m)   Wt 153 lb (69.4 kg)   LMP 06/14/2019   BMI 27.98 kg/m   General Appearance:    Alert, cooperative, no distress, appears stated age  Head:    Normocephalic, without obvious abnormality, atraumatic  Eyes:    PERRL, conjunctiva/corneas clear, EOM's intact, fundi    benign, both eyes  Ears:    Normal TM's and external ear canals, both ears  Nose:   Nares normal, septum midline, mucosa normal, no drainage    or sinus tenderness  Throat:   Lips, mucosa, and tongue normal; teeth and gums normal  Neck:   Supple, symmetrical, trachea midline, no adenopathy;    thyroid:  no enlargement/tenderness/nodules; no carotid   bruit or JVD  Back:     Symmetric,  no curvature, ROM normal, no CVA tenderness  Lungs:     Clear to auscultation bilaterally, respirations unlabored  Chest Wall:    No tenderness or deformity   Heart:    Regular rate and rhythm, S1 and S2 normal, no murmur, rub   or gallop  Breast Exam:    No tenderness, masses, or nipple abnormality  Abdomen:     Soft, non-tender, bowel sounds active all four quadrants,    no masses, no organomegaly  Genitalia:    Normal female without lesion, discharge or tenderness, 10 week size, minimally mobile, deviated distinctly to the left, c/w fibroids,  non-tender uterus, normal adnexal exam      Extremities:   Extremities normal, atraumatic, no cyanosis or edema  Pulses:   2+ and symmetric all extremities  Skin:   Skin color, texture, turgor normal, no rashes or lesions  Lymph nodes:   Cervical, supraclavicular, and axillary nodes normal  Neurologic:   CNII-XII intact, normal strength, sensation and reflexes    throughout  .    Assessment:    Healthy female exam.   Enlarged uterus Heavy periods   Plan:  Thin prep Pap smear. with cotesting STI testing Continue OCPs Check gyn u/s and cbc Come back 2 weeks Refill OCPs Rec see Dr. Sheppard Coil for fasting labs

## 2019-07-10 ENCOUNTER — Ambulatory Visit (INDEPENDENT_AMBULATORY_CARE_PROVIDER_SITE_OTHER): Payer: 59

## 2019-07-10 DIAGNOSIS — N920 Excessive and frequent menstruation with regular cycle: Secondary | ICD-10-CM | POA: Diagnosis not present

## 2019-07-10 DIAGNOSIS — N852 Hypertrophy of uterus: Secondary | ICD-10-CM | POA: Diagnosis not present

## 2019-07-11 LAB — CYTOLOGY - PAP
Chlamydia: NEGATIVE
Comment: NEGATIVE
Comment: NEGATIVE
Comment: NORMAL
Diagnosis: NEGATIVE
High risk HPV: NEGATIVE
Neisseria Gonorrhea: NEGATIVE

## 2019-07-11 LAB — HEPATITIS C ANTIBODY
Hepatitis C Ab: NONREACTIVE
SIGNAL TO CUT-OFF: 0.03 (ref <1.00)

## 2019-07-11 LAB — HIV ANTIBODY (ROUTINE TESTING W REFLEX): HIV 1&2 Ab, 4th Generation: NONREACTIVE

## 2019-07-11 LAB — CBC

## 2019-07-11 LAB — HEPATITIS B SURFACE ANTIGEN: Hepatitis B Surface Ag: NONREACTIVE

## 2019-07-11 LAB — RPR: RPR Ser Ql: NONREACTIVE

## 2019-07-23 ENCOUNTER — Other Ambulatory Visit: Payer: Self-pay

## 2019-07-23 ENCOUNTER — Encounter: Payer: Self-pay | Admitting: Obstetrics & Gynecology

## 2019-07-23 ENCOUNTER — Ambulatory Visit (INDEPENDENT_AMBULATORY_CARE_PROVIDER_SITE_OTHER): Payer: 59

## 2019-07-23 ENCOUNTER — Telehealth: Payer: Self-pay

## 2019-07-23 ENCOUNTER — Telehealth: Payer: 59 | Admitting: Obstetrics & Gynecology

## 2019-07-23 ENCOUNTER — Other Ambulatory Visit: Payer: Self-pay | Admitting: *Deleted

## 2019-07-23 DIAGNOSIS — N920 Excessive and frequent menstruation with regular cycle: Secondary | ICD-10-CM

## 2019-07-23 NOTE — Progress Notes (Signed)
Pt here for blood draw (CBC & TSH). Blood drawn and pt has virtual visit tomorrow to discuss results.

## 2019-07-23 NOTE — Telephone Encounter (Signed)
Called pt to begin virtual visit. Pt did not answer. VM left.

## 2019-07-23 NOTE — Progress Notes (Signed)
Her labs were not drawn. She will have her visit after these results available.

## 2019-07-24 ENCOUNTER — Encounter: Payer: Self-pay | Admitting: Obstetrics & Gynecology

## 2019-07-24 ENCOUNTER — Telehealth: Payer: 59 | Admitting: Obstetrics & Gynecology

## 2019-07-24 LAB — CBC
HCT: 37.1 % (ref 35.0–45.0)
Hemoglobin: 12.2 g/dL (ref 11.7–15.5)
MCH: 28.8 pg (ref 27.0–33.0)
MCHC: 32.9 g/dL (ref 32.0–36.0)
MCV: 87.7 fL (ref 80.0–100.0)
MPV: 12.4 fL (ref 7.5–12.5)
Platelets: 196 10*3/uL (ref 140–400)
RBC: 4.23 10*6/uL (ref 3.80–5.10)
RDW: 12.8 % (ref 11.0–15.0)
WBC: 8.8 10*3/uL (ref 3.8–10.8)

## 2019-07-24 LAB — TSH: TSH: 0.88 mIU/L

## 2019-07-25 NOTE — Progress Notes (Signed)
appt rescheduled (power lost at the office)

## 2019-07-28 ENCOUNTER — Telehealth: Payer: Self-pay | Admitting: *Deleted

## 2019-07-28 NOTE — Telephone Encounter (Signed)
Left patient a message to call and reschedule appointment from 07/24/2019. Power outage at [employer].

## 2019-08-14 ENCOUNTER — Encounter: Payer: Self-pay | Admitting: Obstetrics & Gynecology

## 2019-08-14 ENCOUNTER — Telehealth (INDEPENDENT_AMBULATORY_CARE_PROVIDER_SITE_OTHER): Payer: 59 | Admitting: Obstetrics & Gynecology

## 2019-08-14 ENCOUNTER — Telehealth: Payer: Self-pay

## 2019-08-14 DIAGNOSIS — N920 Excessive and frequent menstruation with regular cycle: Secondary | ICD-10-CM | POA: Diagnosis not present

## 2019-08-14 NOTE — Telephone Encounter (Signed)
Attempted to call pt to begin virtual visit. Pt did not answer. VM left asking pt to return call to the office.

## 2019-08-14 NOTE — Progress Notes (Signed)
    TELEHEALTH GYNECOLOGY VIRTUAL VIDEO VISIT ENCOUNTER NOTE  Provider location: [facility] at [place]   I connected with Morgan Avila on 08/14/19 at  3:00 PM EST by MyChart Video Encounter at home and verified that I am speaking with the correct person using two identifiers.   I discussed the limitations, risks, security and privacy concerns of performing an evaluation and management service virtually and the availability of in person appointments. I also discussed with the patient that there may be a patient responsible charge related to this service. The patient expressed understanding and agreed to proceed.   History:  Morgan Avila is a 33 y.o. [phone] female being evaluated today for follow up of an u/s and labs drawn for evaluation of menorrhagia. She is still taking lo ovral. Her CBC and TSH were normal. Her u/s showed a small submucosal fibroid.     Past Medical History:  Diagnosis Date  . Vaginal Pap smear, abnormal    History reviewed. No pertinent surgical history. The following portions of the patient's history were reviewed and updated as appropriate: allergies, current medications, past family history, past medical history, past social history, past surgical history and problem list.   Health Maintenance:  Normal pap and negative HRHPV on 11/20, h/o LGSIL in 2019  Review of Systems:  Pertinent items noted in HPI and remainder of comprehensive ROS otherwise negative.  Physical Exam:   General:  Alert, oriented and cooperative. Patient appears to be in no acute distress.  Mental Status: Normal mood and affect. Normal behavior. Normal judgment and thought content.   Respiratory: Normal respiratory effort, no problems with respiration noted  Rest of physical exam deferred due to type of encounter  Labs and Imaging No results found for this or any previous visit (from the past 336 hour(s)). No results found.     Assessment and Plan:      Menorrhagia with submucosal fibroid- I discussed the options of watchful waiting versus hysteroscopy and resection of fibroid. At this time she opts for watchful waiting. I rec'd that she start MVIs daily in case she decides to have another pregnancy.      I discussed the assessment and treatment plan with the patient. The patient was provided an opportunity to ask questions and all were answered. The patient agreed with the plan and demonstrated an understanding of the instructions.   The patient was advised to call back or seek an in-person evaluation/go to the ED if the symptoms worsen or if the condition fails to improve as anticipated.  I provided 10 minutes of face-to-face time during this encounter.   Emily Filbert, MD [facility], [place]

## 2019-12-17 ENCOUNTER — Encounter: Payer: Self-pay | Admitting: Osteopathic Medicine

## 2019-12-17 ENCOUNTER — Ambulatory Visit (INDEPENDENT_AMBULATORY_CARE_PROVIDER_SITE_OTHER): Payer: 59 | Admitting: Osteopathic Medicine

## 2019-12-17 ENCOUNTER — Other Ambulatory Visit: Payer: Self-pay

## 2019-12-17 VITALS — BP 110/74 | HR 77 | Wt 155.0 lb

## 2019-12-17 DIAGNOSIS — Z0000 Encounter for general adult medical examination without abnormal findings: Secondary | ICD-10-CM | POA: Diagnosis not present

## 2019-12-17 DIAGNOSIS — E049 Nontoxic goiter, unspecified: Secondary | ICD-10-CM

## 2019-12-17 NOTE — Patient Instructions (Addendum)
General Preventive Care  Most recent routine screening labs: ordered.   Blood pressure goal 130/80 or less.   Tobacco: don't!   Alcohol: responsible moderation is ok for most adults - if you have concerns about your alcohol intake, please talk to me!   Exercise: as tolerated to reduce risk of cardiovascular disease and diabetes. Strength training will also prevent osteoporosis.   Mental health: if need for mental health care (medicines, counseling, other), or concerns about moods, please let me know!   Sexual health: if need for STD testing, or if concerns with libido/pain problems, please let me or OBGYN  know! If you need to discuss birth control options, please let me or OBGYNknow!   Advanced Directive: Living Will and/or Healthcare Power of Attorney recommended for all adults, regardless of age or health.  Vaccines  Flu vaccine: for almost everyone, every fall.   Shingles vaccine: after age 39.   Pneumonia vaccines: after age 54  Tetanus booster: every 10 years (due 2029) / 3rd trimester of pregnancy  HPV vaccine: all done!  COVID vaccine: as soon as you're eligible! Please follow [email or web address] for vaccination appointment. Cancer screenings   Colon cancer screening: for everyone age 85-75.   Breast cancer screening: mammogram at age 69   Cervical cancer screening: Pap per OBGYN  Lung cancer screening: not needed for non-smokers  Infection screenings  . HIV: routine screening done, repeat as needed . Gonorrhea/Chlamydia: screening as needed . Hepatitis C: recommended once for everyone age 11-75 . TB: certain at-risk populations, Other  Bone Density Test: recommended for women at age 49

## 2019-12-17 NOTE — Progress Notes (Signed)
HPI: Morgan Avila is a 34 y.o. female who  has a past medical history of Vaginal Pap smear, abnormal.  she presents to [facility] today, 12/17/19,  for chief complaint of: Annual physical   Doing well, see below    Past medical, surgical, social and family history reviewed:  Patient Active Problem List   Diagnosis Date Noted  . 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 Encounter for biometric screening 11/08/2015  . Enlarged thyroid 11/08/2015    No past surgical history on file.  Social History   Tobacco Use  . Smoking status: Never Smoker  . Smokeless tobacco: Never Used  Substance Use Topics  . Alcohol use: Not Currently    Alcohol/week: 0.0 standard drinks    No family history on file.   Current medication list and allergy/intolerance information reviewed:    Current Outpatient Medications  Medication Sig Dispense Refill  . norgestimate-ethinyl estradiol (ORTHO-CYCLEN) 0.25-35 MG-MCG tablet Take 1 tablet by mouth daily. 3 Package 5   No current facility-administered medications for this visit.    No Known Allergies    Review of Systems:  Constitutional:  No  fever, no chills, No recent illness  HEENT: No  headache, no vision change  Cardiac: No  chest pain, No  pressure, No palpitations, No  Orthopnea  Respiratory:  No  shortness of breath. No  Cough  Gastrointestinal: No  abdominal pain  Musculoskeletal: No new myalgia/arthralgia  Skin: No  Rash, No other wounds/concerning lesions  Genitourinary: No  incontinence, No  abnormal genital bleeding, No abnormal genital discharge  Endocrine: No cold intolerance,  No heat intolerance.   Neurologic: No  weakness, No  dizziness  Psychiatric: No  concerns with depression, No  concerns with anxiety,  Exam:  BP 110/74 (BP Location: Right Arm, Patient Position: Sitting, Cuff Size: Normal)   Pulse 77   Wt 155 lb (70.3 kg)   SpO2 99%   BMI 28.35 kg/m    Constitutional: VS see above. General Appearance: alert, well-developed, well-nourished, NAD  Eyes: Normal lids and conjunctive, non-icteric sclera  Ears, Nose, Mouth, Throat:TM normal bilaterally.   Neck: No masses, trachea midline. Slight thyroid enlargement w/ normal texture and no tenderness. No tenderness/mass appreciated. No lymphadenopathy  Respiratory: Normal respiratory effort. no wheeze, no rhonchi, no rales  Cardiovascular: S1/S2 normal, no murmur, no rub/gallop auscultated. RRR. No lower extremity edema  Gastrointestinal: Nontender, no masses. No hepatomegaly, no splenomegaly. No hernia appreciated. Bowel sounds normal. Rectal exam deferred.   Musculoskeletal: Gait normal. No clubbing/cyanosis of digits.   Neurological: Normal balance/coordination. No tremor.   Skin: warm, dry, intact. No rash/ulcer. No concerning nevi or subq nodules on limited exam.    Psychiatric: Normal judgment/insight. Normal mood and affect. Oriented x3.    No results found for this or any previous visit (from the past 72 hour(s)).  No results found.   ASSESSMENT/PLAN: The primary encounter diagnosis was Annual physical exam. A diagnosis of Thyroid enlarged was also pertinent to this visit.   Orders Placed This Encounter  Procedures  . CBC  . COMPLETE METABOLIC PANEL WITH GFR  . Lipid panel  . TSH + free T4    No orders of the defined types were placed in this encounter.   Patient Instructions  General Preventive Care  Most recent routine screening labs: ordered.   Blood pressure goal 130/80 or less.   Tobacco: don't!   Alcohol: responsible moderation is ok for most  adults - if you have concerns about your alcohol intake, please talk to me!   Exercise: as tolerated to reduce risk of cardiovascular disease and diabetes. Strength training will also prevent osteoporosis.   Mental health: if need for mental health care (medicines, counseling, other), or concerns about moods,  please let me know!   Sexual health: if need for STD testing, or if concerns with libido/pain problems, please let me or OBGYN  know! If you need to discuss birth control options, please let me or OBGYNknow!   Advanced Directive: Living Will and/or Healthcare Power of Attorney recommended for all adults, regardless of age or health.  Vaccines  Flu vaccine: for almost everyone, every fall.   Shingles vaccine: after age 16.   Pneumonia vaccines: after age 35  Tetanus booster: every 10 years (due 2029) / 3rd trimester of pregnancy  HPV vaccine: all done!  COVID vaccine: as soon as you're eligible! Please follow [email or web address] for vaccination appointment. Cancer screenings   Colon cancer screening: for everyone age 20-75.   Breast cancer screening: mammogram at age 47   Cervical cancer screening: Pap per OBGYN  Lung cancer screening: not needed for non-smokers  Infection screenings  . HIV: routine screening done, repeat as needed . Gonorrhea/Chlamydia: screening as needed . Hepatitis C: recommended once for everyone age 63-75 . TB: certain at-risk populations, Other  Bone Density Test: recommended for women at age 66        Visit summary with medication list and pertinent instructions was printed for patient to review. All questions at time of visit were answered - patient instructed to contact office with any additional concerns or updates. ER/RTC precautions were reviewed with the patient.      Please note: voice recognition software was used to produce this document, and typos may escape review. Please contact Dr. Lyn Hollingshead for any needed clarifications.     Follow-up plan: Return in about 1 year (around 12/16/2020) for [place] (call week prior to visit for lab orders).

## 2019-12-18 LAB — CBC
HCT: 39.1 % (ref 35.0–45.0)
Hemoglobin: 13.2 g/dL (ref 11.7–15.5)
MCH: 29.7 pg (ref 27.0–33.0)
MCHC: 33.8 g/dL (ref 32.0–36.0)
MCV: 88.1 fL (ref 80.0–100.0)
MPV: 12.3 fL (ref 7.5–12.5)
Platelets: 183 10*3/uL (ref 140–400)
RBC: 4.44 10*6/uL (ref 3.80–5.10)
RDW: 13.1 % (ref 11.0–15.0)
WBC: 9 10*3/uL (ref 3.8–10.8)

## 2019-12-18 LAB — COMPLETE METABOLIC PANEL WITH GFR
AG Ratio: 1.5 (calc) (ref 1.0–2.5)
ALT: 13 U/L (ref 6–29)
AST: 18 U/L (ref 10–30)
Albumin: 4.1 g/dL (ref 3.6–5.1)
Alkaline phosphatase (APISO): 68 U/L (ref 31–125)
BUN: 16 mg/dL (ref 7–25)
CO2: 28 mmol/L (ref 20–32)
Calcium: 9 mg/dL (ref 8.6–10.2)
Chloride: 104 mmol/L (ref 98–110)
Creat: 0.85 mg/dL (ref 0.50–1.10)
GFR, Est African American: 106 mL/min/{1.73_m2} (ref >=60)
GFR, Est Non African American: 91 mL/min/{1.73_m2} (ref >=60)
Globulin: 2.8 g/dL (calc) (ref 1.9–3.7)
Glucose, Bld: 79 mg/dL (ref 65–99)
Potassium: 4.3 mmol/L (ref 3.5–5.3)
Sodium: 136 mmol/L (ref 135–146)
Total Bilirubin: 0.4 mg/dL (ref 0.2–1.2)
Total Protein: 6.9 g/dL (ref 6.1–8.1)

## 2019-12-18 LAB — LIPID PANEL
Cholesterol: 147 mg/dL (ref <200)
HDL: 53 mg/dL (ref >=50)
LDL Cholesterol (Calc): 79 mg/dL (calc)
Non-HDL Cholesterol (Calc): 94 mg/dL (calc) (ref <130)
Total CHOL/HDL Ratio: 2.8 (calc) (ref <5.0)
Triglycerides: 70 mg/dL (ref <150)

## 2019-12-18 LAB — TSH+FREE T4: TSH W/REFLEX TO FT4: 0.9 mIU/L

## 2020-04-26 ENCOUNTER — Other Ambulatory Visit: Payer: Self-pay

## 2020-04-26 ENCOUNTER — Ambulatory Visit (INDEPENDENT_AMBULATORY_CARE_PROVIDER_SITE_OTHER): Payer: 59

## 2020-04-26 DIAGNOSIS — Z3200 Encounter for pregnancy test, result unknown: Secondary | ICD-10-CM | POA: Diagnosis not present

## 2020-04-26 LAB — POCT URINE PREGNANCY: Preg Test, Ur: POSITIVE — AB

## 2020-04-26 NOTE — Progress Notes (Addendum)
Pt had positive UPT at home and wants to confirm at office. UPT positive. LMP 03/12/20. Pt scheduled NOB appt.

## 2020-05-04 ENCOUNTER — Telehealth: Payer: Self-pay

## 2020-05-04 NOTE — Telephone Encounter (Signed)
Pt called and stated that she is [redacted] weeks pregnant (NOB appt scheduled for 05/25/20) and is bleeding as heavy as a period and is cramping. Pt was told to go to [facility] for evaluation. Pt expressed understanding.

## 2020-05-25 ENCOUNTER — Encounter: Payer: 59 | Admitting: Certified Nurse Midwife

## 2020-12-13 ENCOUNTER — Telehealth: Payer: Self-pay | Admitting: General Practice

## 2020-12-13 NOTE — Telephone Encounter (Signed)
Transition Care Management Unsuccessful Follow-up Telephone Call  Date of discharge and from where:  12/13/20 [facility]  Attempts:  1st Attempt  Reason for unsuccessful TCM follow-up call:  Left voice message

## 2020-12-15 NOTE — Telephone Encounter (Signed)
Transition Care Management Unsuccessful Follow-up Telephone Call  Date of discharge and from where:  [facility] 12/13/20  Attempts:  2nd Attempt  Reason for unsuccessful TCM follow-up call:  Left voice message

## 2020-12-16 NOTE — Telephone Encounter (Signed)
Transition Care Management Unsuccessful Follow-up Telephone Call  Date of discharge and from where:  12/13/2020 from [facility]  Attempts:  3rd Attempt  Reason for unsuccessful TCM follow-up call:  Unable to reach patient

## 2021-07-17 IMAGING — US US TRANSVAGINAL NON-OB
1 series · 14 of 25 positions shown · non-contrast
Comparison: None

CLINICAL DATA: Menorrhagia with regular cycle, enlarged uterus with
heavy menstrual periods

EXAM:
ULTRASOUND PELVIS TRANSVAGINAL
TECHNIQUE: Transvaginal ultrasound examination of the pelvis was performed
including evaluation of the uterus, ovaries, adnexal regions, and
pelvic cul-de-sac. Transabdominal imaging was not ordered

[Series 1: us transvaginal non-ob · 0.09mm/px · 14 of 165 slices shown]
[im 1/165]
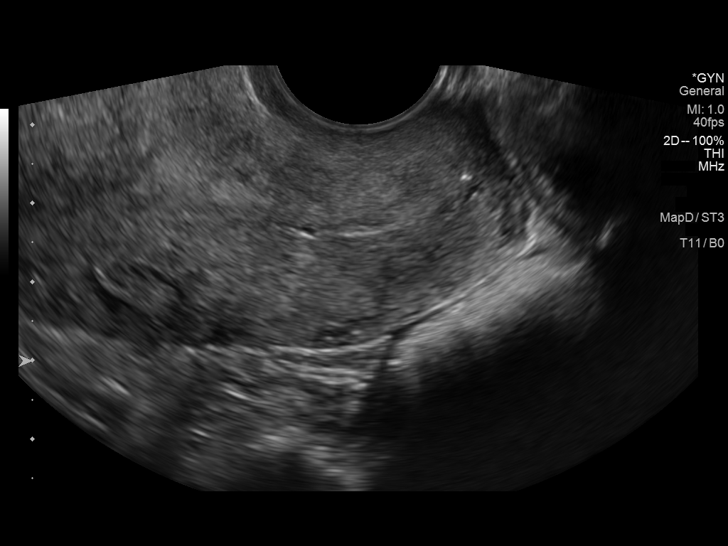
[im 14/165]
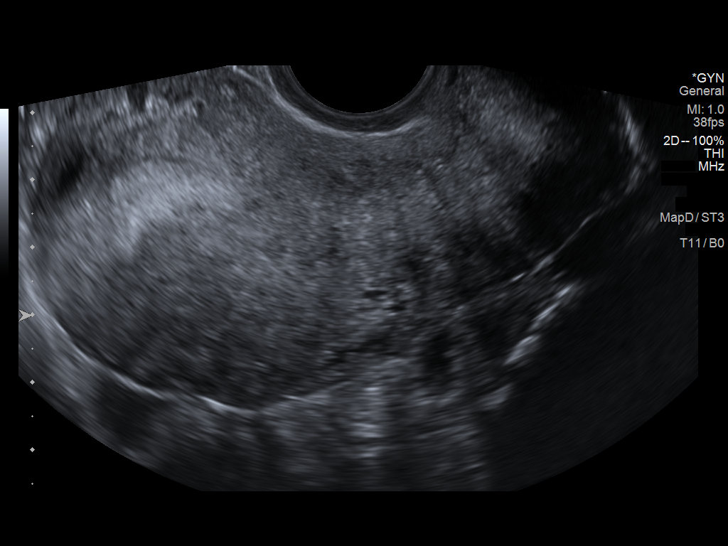
[im 28/165]
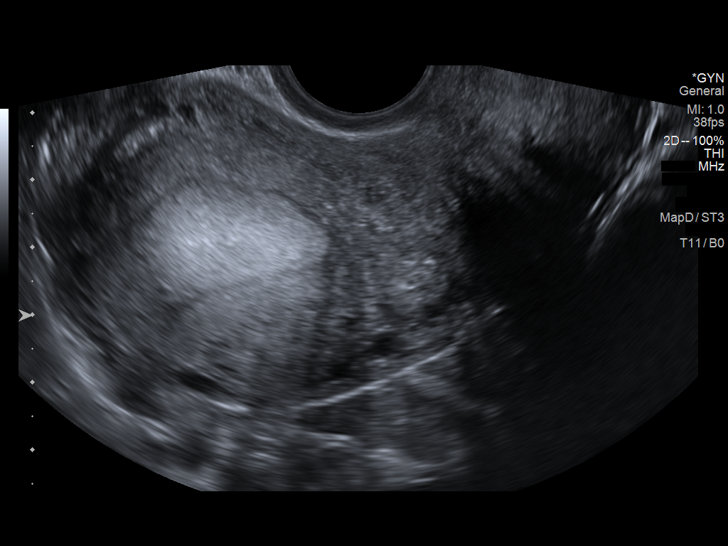
[im 42/165]
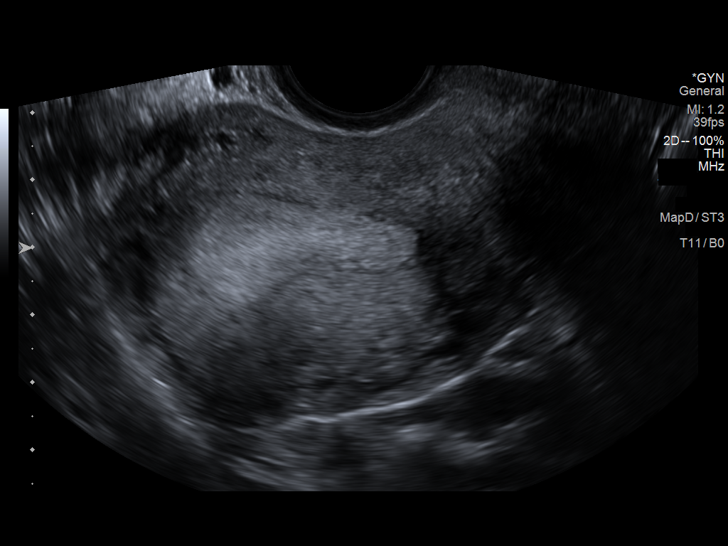
[im 55/165]
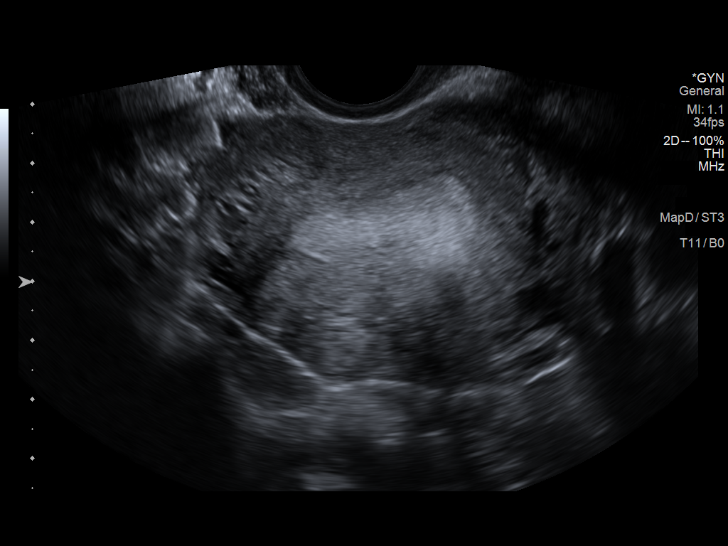
[im 62/165]
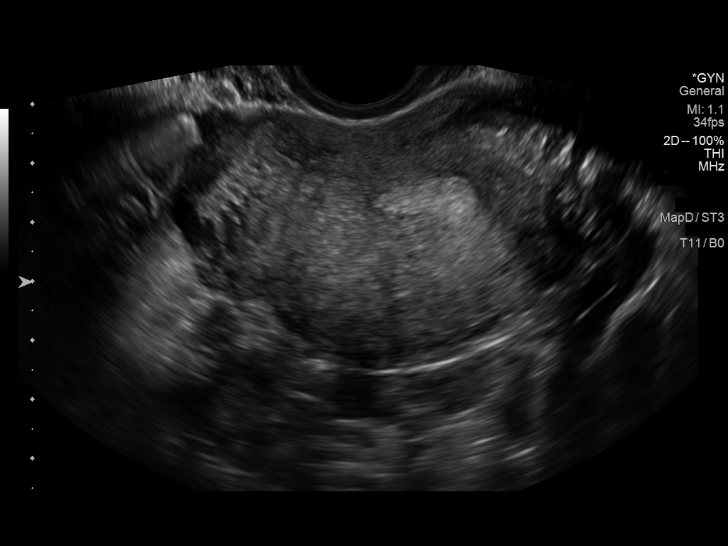
[im 76/165]
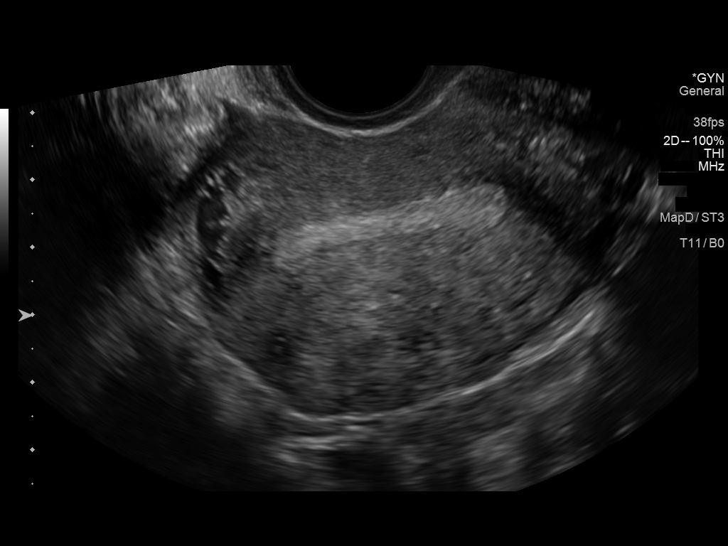
[im 89/165]
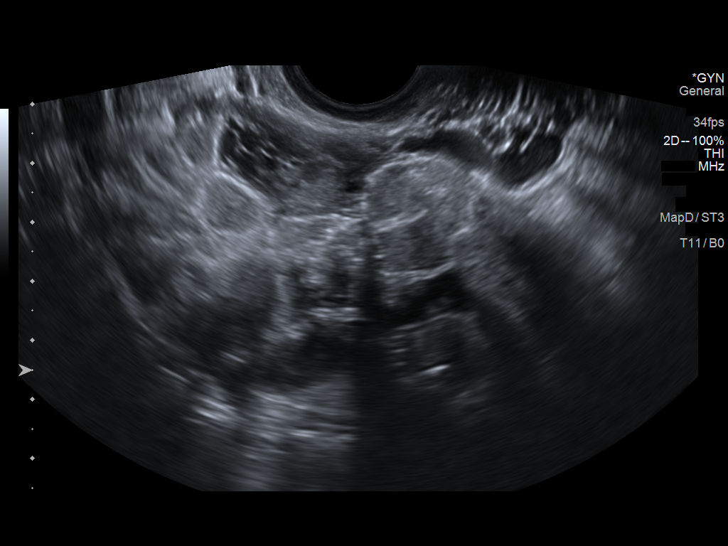
[im 103/165]
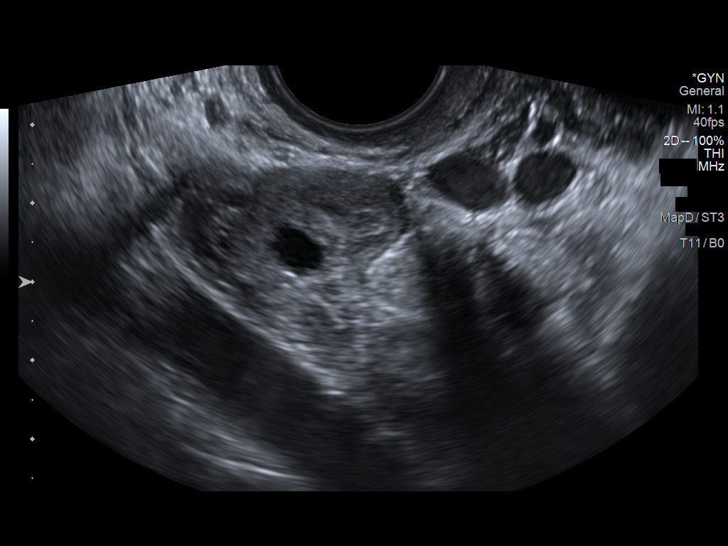
[im 110/165]
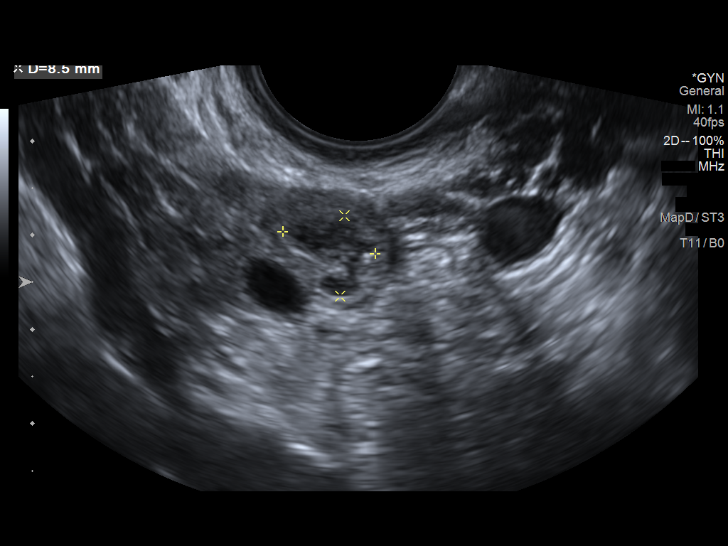
[im 124/165]
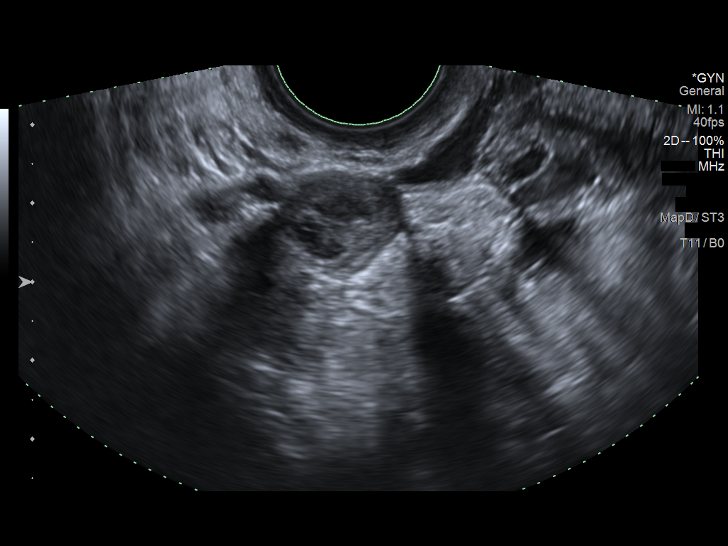
[im 137/165]
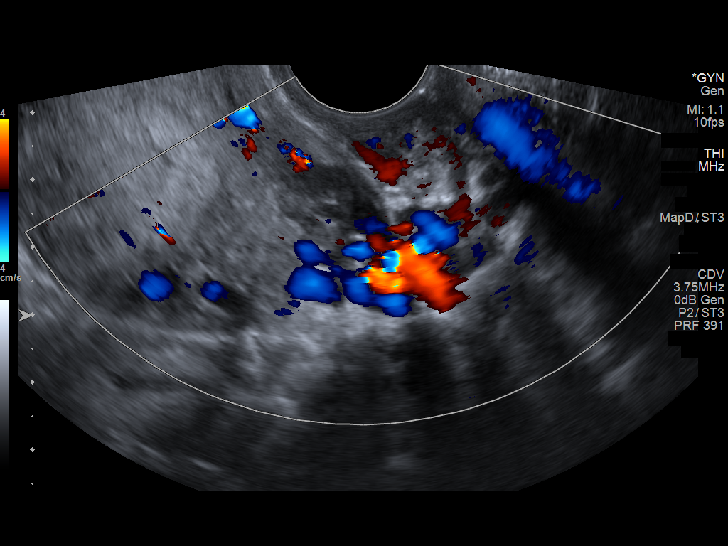
[im 151/165]
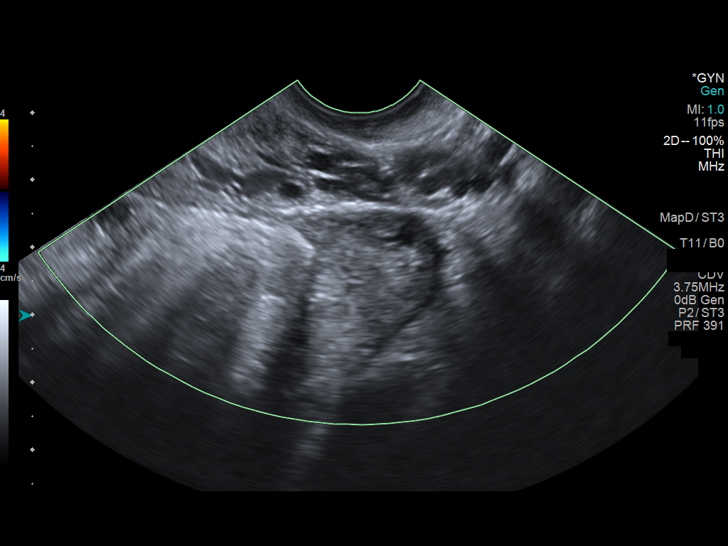
[im 165/165]
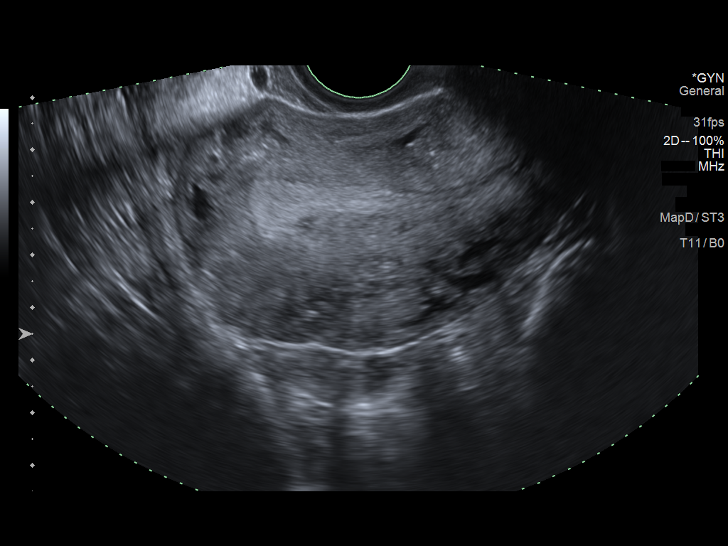

[14 of 25 positions shown; findings below may reference images not displayed]

FINDINGS: Uterus

Measurements: 9.0 x 5.1 x 6.5 cm = volume: 158 mL. Anteverted.
Normal morphology. Questionable subtle isoechoic leiomyoma at
posterior upper uterus, 2.6 x 2.5 x 2.4 cm, extending submucosal.

Endometrium

Thickness: 16 mm. No endometrial fluid or focal abnormality no focal
abnormality visualized.

Right ovary

Measurements: 2.9 x 1.7 x 2.2 cm = volume: 6 mL. Normal morphology
without mass

Left ovary

Measurements: 3.2 x 1.8 x 2.4 cm = volume: 7 mL. Normal morphology
without mass

Other findings: No free pelvic fluid or adnexal masses. Prominent
parametrial vessels.
IMPRESSION: Question subtle isoechoic submucosal leiomyoma at posterior upper
uterus.

Remainder of exam unremarkable.
